# Patient Record
Sex: Male | Born: 1970
Health system: Southern US, Community
[De-identification: ages and names within clinical notes are randomized; demographics above are authoritative.]

---

## 2004-03-25 ENCOUNTER — Emergency Department (HOSPITAL_COMMUNITY): Admission: EM | Admit: 2004-03-25 | Discharge: 2004-03-25 | Payer: Self-pay | Admitting: Family Medicine

## 2006-11-06 IMAGING — CR DG CHEST 2V
2 series · 2 of 2 positions shown · non-contrast
Comparison: none

CLINICAL DATA: Fever for three days. 
 CHEST ? 2 VIEW:

[view not recorded (1 of 2)]
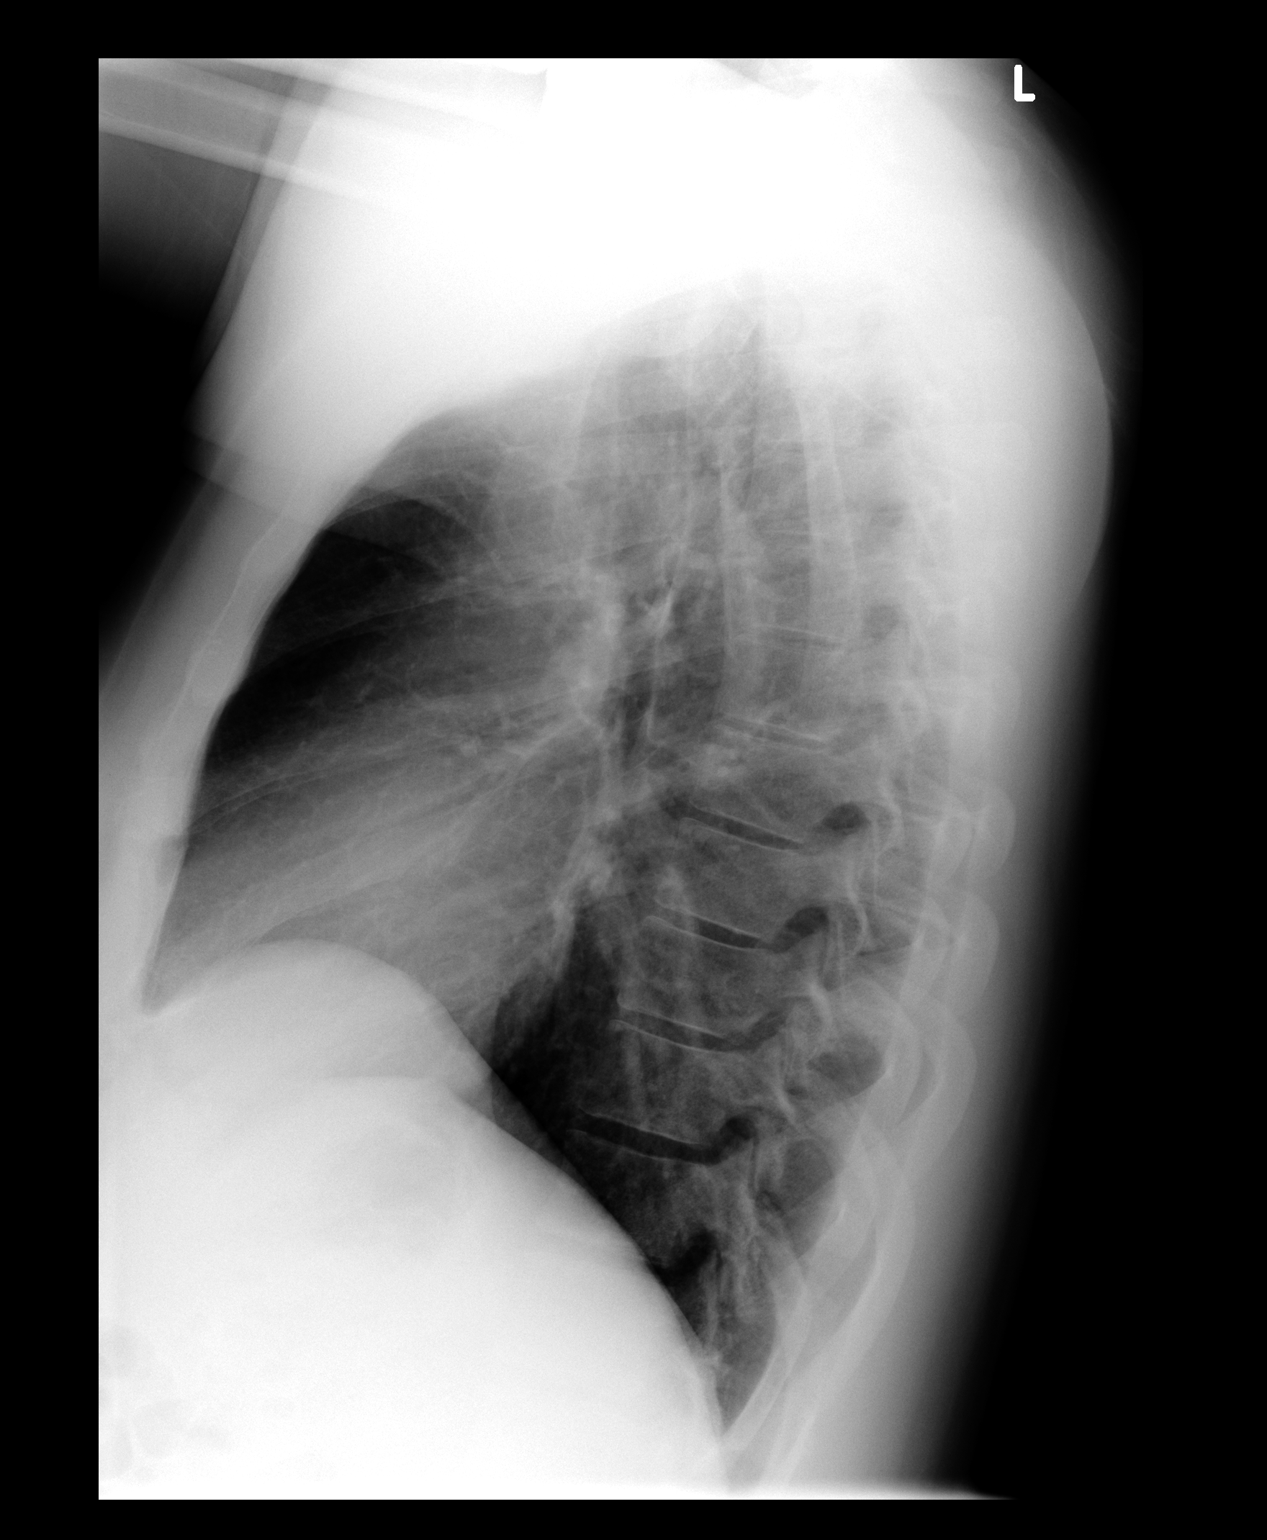

[view not recorded (2 of 2)]
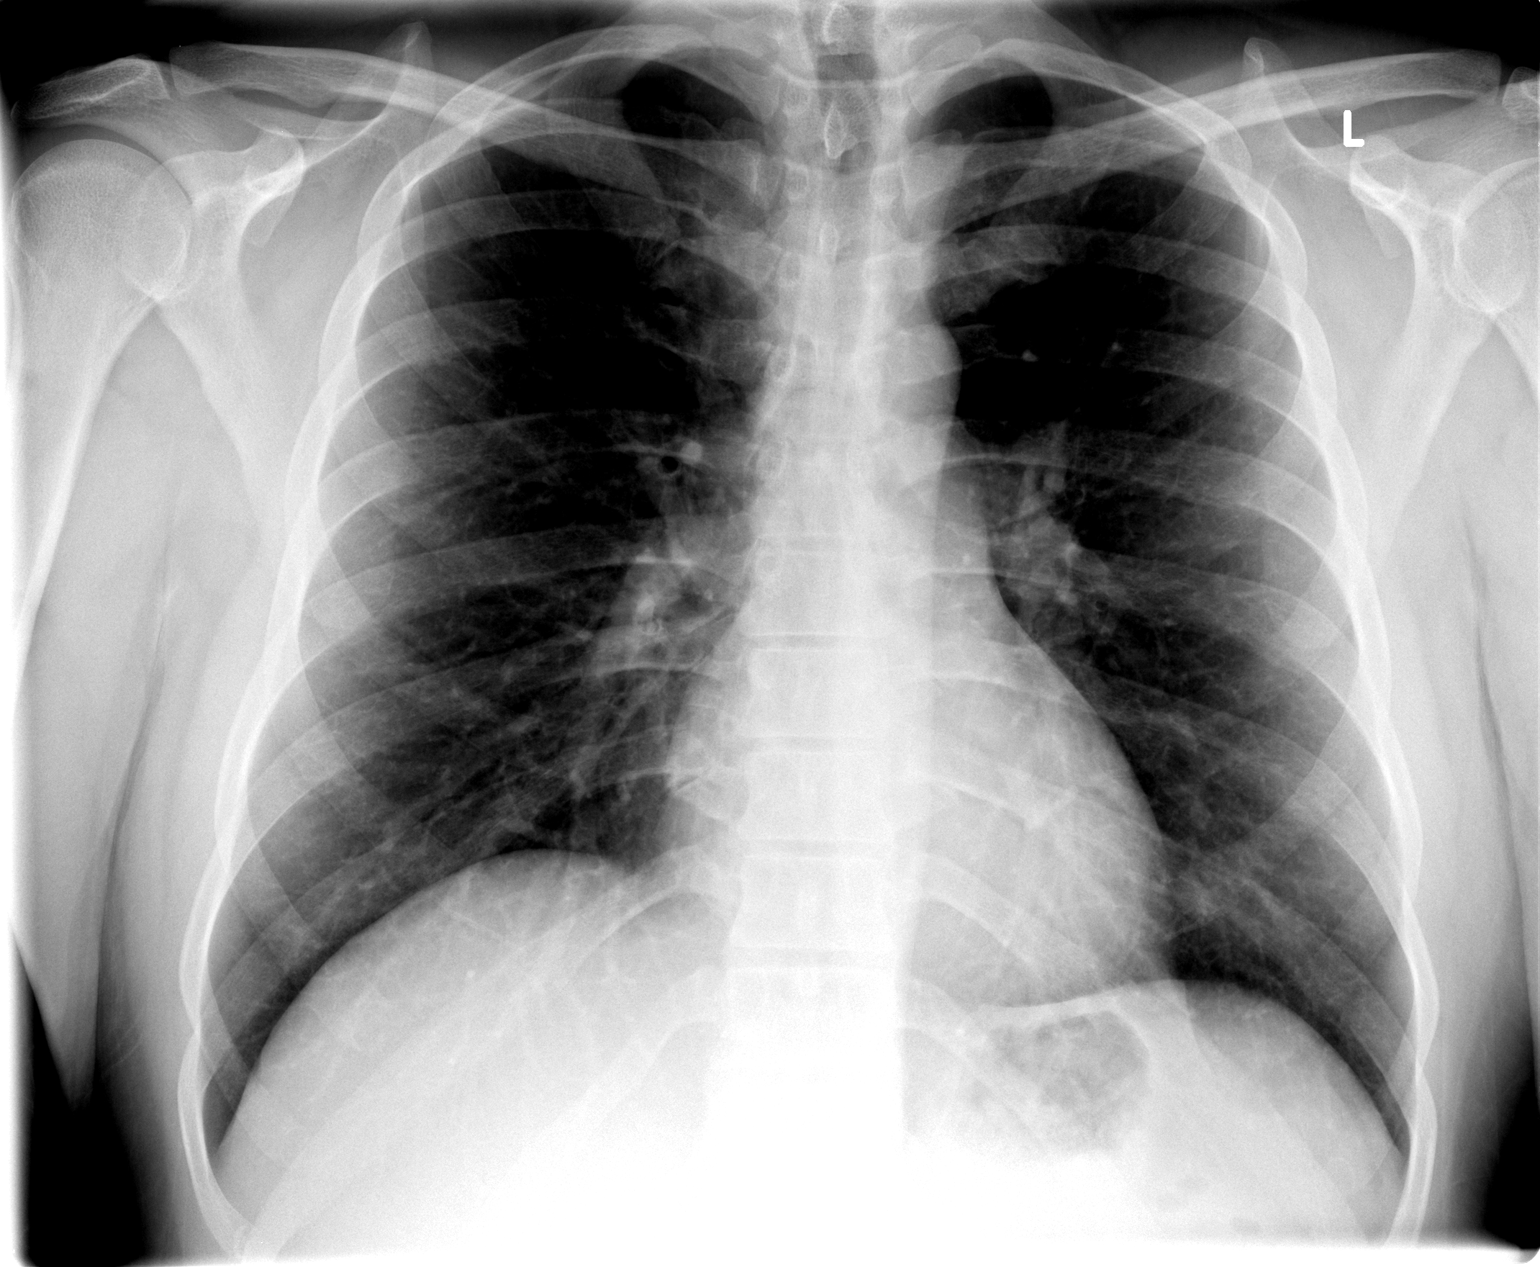

[2 of 2 positions shown; findings below may reference images not displayed]

FINDINGS: The lungs are clear.  Costophrenic angles sharp.  Heart size normal.
IMPRESSION: Negative chest.

## 2017-01-09 ENCOUNTER — Emergency Department (HOSPITAL_BASED_OUTPATIENT_CLINIC_OR_DEPARTMENT_OTHER): Payer: BLUE CROSS/BLUE SHIELD

## 2017-01-09 ENCOUNTER — Encounter (HOSPITAL_BASED_OUTPATIENT_CLINIC_OR_DEPARTMENT_OTHER): Payer: Self-pay | Admitting: Emergency Medicine

## 2017-01-09 ENCOUNTER — Emergency Department (HOSPITAL_BASED_OUTPATIENT_CLINIC_OR_DEPARTMENT_OTHER)
Admission: EM | Admit: 2017-01-09 | Discharge: 2017-01-09 | Disposition: A | Discharge status: Home / Self Care | Payer: BLUE CROSS/BLUE SHIELD | Attending: Emergency Medicine | Admitting: Emergency Medicine

## 2017-01-09 ENCOUNTER — Other Ambulatory Visit: Payer: Self-pay

## 2017-01-09 DIAGNOSIS — Z87891 Personal history of nicotine dependence: Secondary | ICD-10-CM | POA: Diagnosis not present

## 2017-01-09 DIAGNOSIS — R1013 Epigastric pain: Secondary | ICD-10-CM | POA: Diagnosis present

## 2017-01-09 LAB — URINALYSIS, ROUTINE W REFLEX MICROSCOPIC
Bilirubin Urine: NEGATIVE
GLUCOSE, UA: NEGATIVE mg/dL
Hgb urine dipstick: NEGATIVE
Ketones, ur: 15 mg/dL — AB
LEUKOCYTES UA: NEGATIVE
Nitrite: NEGATIVE
PROTEIN: 30 mg/dL — AB
Specific Gravity, Urine: 1.015 (ref 1.005–1.030)
pH: >9 — ABNORMAL HIGH (ref 5.0–8.0)

## 2017-01-09 LAB — COMPREHENSIVE METABOLIC PANEL
ALT: 35 U/L (ref 17–63)
ANION GAP: 8 (ref 5–15)
AST: 27 U/L (ref 15–41)
Albumin: 4.2 g/dL (ref 3.5–5.0)
Alkaline Phosphatase: 47 U/L (ref 38–126)
BUN: 15 mg/dL (ref 6–20)
CHLORIDE: 106 mmol/L (ref 101–111)
CO2: 23 mmol/L (ref 22–32)
CREATININE: 0.98 mg/dL (ref 0.61–1.24)
Calcium: 9.2 mg/dL (ref 8.9–10.3)
Glucose, Bld: 126 mg/dL — ABNORMAL HIGH (ref 65–99)
POTASSIUM: 3.8 mmol/L (ref 3.5–5.1)
Sodium: 137 mmol/L (ref 135–145)
Total Bilirubin: 0.6 mg/dL (ref 0.3–1.2)
Total Protein: 8 g/dL (ref 6.5–8.1)

## 2017-01-09 LAB — CBC
HCT: 48.4 % (ref 39.0–52.0)
HEMOGLOBIN: 16.3 g/dL (ref 13.0–17.0)
MCH: 30 pg (ref 26.0–34.0)
MCHC: 33.7 g/dL (ref 30.0–36.0)
MCV: 89 fL (ref 78.0–100.0)
PLATELETS: 249 10*3/uL (ref 150–400)
RBC: 5.44 MIL/uL (ref 4.22–5.81)
RDW: 13.2 % (ref 11.5–15.5)
WBC: 11.5 10*3/uL — AB (ref 4.0–10.5)

## 2017-01-09 LAB — URINALYSIS, MICROSCOPIC (REFLEX)
BACTERIA UA: NONE SEEN
SQUAMOUS EPITHELIAL / LPF: NONE SEEN
WBC, UA: NONE SEEN WBC/hpf (ref 0–5)

## 2017-01-09 LAB — LIPASE, BLOOD: LIPASE: 40 U/L (ref 11–51)

## 2017-01-09 MED ORDER — ONDANSETRON HCL 4 MG/2ML IJ SOLN
4.0000 mg | Freq: Once | INTRAMUSCULAR | Status: AC
  Administered 2017-01-09: 4 mg via INTRAVENOUS
  Filled 2017-01-09: qty 2

## 2017-01-09 MED ORDER — HYDROMORPHONE HCL 1 MG/ML IJ SOLN
1.0000 mg | Freq: Once | INTRAMUSCULAR | Status: AC
  Administered 2017-01-09: 1 mg via INTRAVENOUS
  Filled 2017-01-09: qty 1

## 2017-01-09 MED ORDER — SODIUM CHLORIDE 0.9 % IV BOLUS (SEPSIS)
1000.0000 mL | Freq: Once | INTRAVENOUS | Status: AC
  Administered 2017-01-09: 1000 mL via INTRAVENOUS

## 2017-01-09 MED ORDER — ONDANSETRON 8 MG PO TBDP: 8.0000 mg | ORAL_TABLET | Freq: Three times a day (TID) | ORAL | 0 refills | Status: AC | PRN

## 2017-01-09 NOTE — ED Provider Notes (Signed)
[facility] EMERGENCY DEPARTMENT Provider Note   CSN: [id number] Arrival date & time: 01/09/17  1842     History   Chief Complaint Chief Complaint  Patient presents with  . Abdominal Pain    HPI Derrick Rodriguez is a 46 y.o. male.  The history is provided by the patient.  Abdominal Pain   This is a new problem. The current episode started 6 to 12 hours ago. The problem occurs constantly. The problem has not changed since onset.The pain is associated with eating (pt had just eaten at [employer]). The pain is located in the epigastric region. The pain is severe. Associated symptoms include nausea and vomiting. Pertinent negatives include fever, diarrhea, dysuria and hematuria. Nothing aggravates the symptoms. Nothing relieves the symptoms. Past medical history comments: no history of prior abdominal pain.    History reviewed. No pertinent past medical history.  There are no active problems to display for this patient.   History reviewed. No pertinent surgical history.     Home Medications    Prior to Admission medications   Medication Sig Start Date End Date Taking? Authorizing Provider  ondansetron (ZOFRAN ODT) 8 MG disintegrating tablet Take 1 tablet (8 mg total) by mouth every 8 (eight) hours as needed for nausea or vomiting. 01/09/17   Linwood DibblesKnapp, Sarajean Dessert, MD    Family History History reviewed. No pertinent family history.  Social History Social History   Tobacco Use  . Smoking status: Former Games developermoker  . Smokeless tobacco: Never Used  Substance Use Topics  . Alcohol use: No    Frequency: Never  . Drug use: No     Allergies   Patient has no known allergies.   Review of Systems Review of Systems  Constitutional: Negative for fever.  Gastrointestinal: Positive for abdominal pain, nausea and vomiting. Negative for diarrhea.  Genitourinary: Negative for dysuria and hematuria.     Physical Exam Updated Vital Signs BP 113/67 (BP Location: Left Arm)   Pulse  88   Temp 98.1 F (36.7 C) (Oral)   Resp 18   Ht 1.829 m (6')   Wt 124.7 kg (275 lb)   SpO2 98%   BMI 37.30 kg/m   Physical Exam  Constitutional: He appears well-developed and well-nourished. No distress.  HENT:  Head: Normocephalic and atraumatic.  Right Ear: External ear normal.  Left Ear: External ear normal.  Eyes: Conjunctivae are normal. Right eye exhibits no discharge. Left eye exhibits no discharge. No scleral icterus.  Neck: Neck supple. No tracheal deviation present.  Cardiovascular: Normal rate, regular rhythm and intact distal pulses.  Pulmonary/Chest: Effort normal and breath sounds normal. No stridor. No respiratory distress. He has no wheezes. He has no rales.  Abdominal: Soft. Bowel sounds are normal. He exhibits no distension. There is no tenderness. There is no rebound and no guarding.  Musculoskeletal: He exhibits no edema or tenderness.  Neurological: He is alert. He has normal strength. No sensory deficit. Cranial nerve deficit: no gross deficits. He exhibits normal muscle tone. He displays no seizure activity. Coordination normal.  Skin: Skin is warm and dry. No rash noted.  Psychiatric: He has a normal mood and affect.  Nursing note and vitals reviewed.    ED Treatments / Results  Labs (all labs ordered are listed, but only abnormal results are displayed) Labs Reviewed  COMPREHENSIVE METABOLIC PANEL - Abnormal; Notable for the following components:      Result Value   Glucose, Bld 126 (*)    All other  components within normal limits  CBC - Abnormal; Notable for the following components:   WBC 11.5 (*)    All other components within normal limits  URINALYSIS, ROUTINE W REFLEX MICROSCOPIC - Abnormal; Notable for the following components:   pH >9.0 (*)    Ketones, ur 15 (*)    Protein, ur 30 (*)    All other components within normal limits  LIPASE, BLOOD  URINALYSIS, MICROSCOPIC (REFLEX)    EKG  EKG Interpretation  Date/Time:  Sunday January 09 2017 18:49:32 EST Ventricular Rate:  64 PR Interval:  172 QRS Duration: 114 QT Interval:  400 QTC Calculation: 412 R Axis:   47 Text Interpretation:  Normal sinus rhythm with sinus arrhythmia Normal ECG No old tracing to compare Confirmed by Denys Labree ([id number]) on 01/09/2017 8:55:13 PM       Radiology No results found.  Procedures Procedures (including critical care time)  Medications Ordered in ED Medications  HYDROmorphone (DILAUDID) injection 1 mg (1 mg Intravenous Given 01/09/17 2209)  ondansetron (ZOFRAN) injection 4 mg (4 mg Intravenous Given 01/09/17 2209)  sodium chloride 0.9 % bolus 1,000 mL (0 mLs Intravenous Stopped 01/09/17 2320)     Initial Impression / Assessment and Plan / ED Course  I have reviewed the triage vital signs and the nursing notes.  Pertinent labs & imaging results that were available during my care of the patient were reviewed by me and considered in my medical decision making (see chart for details).   Slight increase in wbc . Abdominal exam is benign.  Pt did have an elevated WBC and had severe pain earlier.  ?biliary colic.  Offered to do a CT scan tonight to evaluate further.  Pt decided he was feeling better and would go home.  He knows to return if he has any recurrent symptoms  Final Clinical Impressions(s) / ED Diagnoses   Final diagnoses:  Epigastric pain    ED Discharge Orders        Ordered    ondansetron (ZOFRAN ODT) 8 MG disintegrating tablet  Every 8 hours PRN     12 /30/18 2335       Linwood DibblesKnapp, Shira Bobst, MD 01/09/17 2337

## 2017-01-09 NOTE — ED Notes (Signed)
Pt given d/c instructions as per chart. Rx x 1. Verbalizes understanding. No questions. 

## 2017-01-09 NOTE — Discharge Instructions (Signed)
Return to the emergency room for any recurrent symptoms, take the medications as needed for nausea, make sure to drink plenty of fluids, take over-the-counter medications as needed for pain

## 2017-01-09 NOTE — ED Triage Notes (Signed)
Patient reports upper abdominal pain with 2 episodes of vomiting which began 4 hours PTA.  Denies chest pain.  Reports shortness of breath.

## 2020-02-12 ENCOUNTER — Ambulatory Visit: Payer: Self-pay
